# Patient Record
Sex: Female | Born: 1980 | Race: White | Hispanic: Yes | Marital: Single
Health system: Southern US, Community
[De-identification: ages and names within clinical notes are randomized; demographics above are authoritative.]

---

## 2007-02-24 ENCOUNTER — Ambulatory Visit (HOSPITAL_COMMUNITY): Admission: RE | Admit: 2007-02-24 | Discharge: 2007-02-24 | Payer: Self-pay | Admitting: Obstetrics & Gynecology

## 2007-04-19 ENCOUNTER — Inpatient Hospital Stay (HOSPITAL_COMMUNITY): Admission: AD | Admit: 2007-04-19 | Discharge: 2007-04-21 | Payer: Self-pay | Admitting: Gynecology

## 2007-04-19 ENCOUNTER — Ambulatory Visit: Payer: Self-pay | Admitting: Obstetrics & Gynecology

## 2007-04-24 ENCOUNTER — Ambulatory Visit: Payer: Self-pay | Admitting: Obstetrics & Gynecology

## 2007-04-26 ENCOUNTER — Ambulatory Visit: Payer: Self-pay | Admitting: *Deleted

## 2007-04-26 ENCOUNTER — Inpatient Hospital Stay (HOSPITAL_COMMUNITY): Admission: AD | Admit: 2007-04-26 | Discharge: 2007-04-28 | Payer: Self-pay | Admitting: Obstetrics & Gynecology

## 2009-01-09 ENCOUNTER — Ambulatory Visit (HOSPITAL_COMMUNITY): Admission: RE | Admit: 2009-01-09 | Discharge: 2009-01-09 | Payer: Self-pay | Admitting: Obstetrics & Gynecology

## 2009-01-17 ENCOUNTER — Ambulatory Visit (HOSPITAL_COMMUNITY): Admission: RE | Admit: 2009-01-17 | Discharge: 2009-01-17 | Payer: Self-pay | Admitting: Obstetrics & Gynecology

## 2009-01-31 ENCOUNTER — Ambulatory Visit (HOSPITAL_COMMUNITY): Admission: RE | Admit: 2009-01-31 | Discharge: 2009-01-31 | Payer: Self-pay | Admitting: Obstetrics & Gynecology

## 2009-02-24 ENCOUNTER — Encounter: Admission: RE | Admit: 2009-02-24 | Discharge: 2009-03-10 | Payer: Self-pay | Admitting: Obstetrics & Gynecology

## 2009-02-24 ENCOUNTER — Ambulatory Visit: Payer: Self-pay | Admitting: Obstetrics & Gynecology

## 2009-03-03 ENCOUNTER — Ambulatory Visit: Payer: Self-pay | Admitting: Obstetrics & Gynecology

## 2009-03-03 ENCOUNTER — Ambulatory Visit (HOSPITAL_COMMUNITY): Admission: RE | Admit: 2009-03-03 | Discharge: 2009-03-03 | Payer: Self-pay | Admitting: Obstetrics & Gynecology

## 2009-03-03 ENCOUNTER — Encounter: Payer: Self-pay | Admitting: Obstetrics & Gynecology

## 2009-03-03 LAB — CONVERTED CEMR LAB
Chlamydia, DNA Probe: NEGATIVE
GC Probe Amp, Genital: NEGATIVE

## 2009-03-04 ENCOUNTER — Encounter: Payer: Self-pay | Admitting: Obstetrics & Gynecology

## 2009-03-10 ENCOUNTER — Ambulatory Visit: Payer: Self-pay | Admitting: Obstetrics & Gynecology

## 2009-03-14 ENCOUNTER — Ambulatory Visit: Payer: Self-pay | Admitting: Obstetrics & Gynecology

## 2009-03-17 ENCOUNTER — Ambulatory Visit: Payer: Self-pay | Admitting: Obstetrics & Gynecology

## 2009-03-21 ENCOUNTER — Ambulatory Visit: Payer: Self-pay | Admitting: Obstetrics & Gynecology

## 2009-03-23 ENCOUNTER — Inpatient Hospital Stay (HOSPITAL_COMMUNITY): Admission: AD | Admit: 2009-03-23 | Discharge: 2009-03-25 | Payer: Self-pay | Admitting: Obstetrics and Gynecology

## 2009-03-23 ENCOUNTER — Ambulatory Visit: Payer: Self-pay | Admitting: Obstetrics and Gynecology

## 2010-09-17 LAB — POCT URINALYSIS DIP (DEVICE)
Bilirubin Urine: NEGATIVE
Glucose, UA: NEGATIVE mg/dL
Ketones, ur: NEGATIVE mg/dL
Nitrite: NEGATIVE
Protein, ur: NEGATIVE mg/dL
Specific Gravity, Urine: <=1.005 (ref 1.005–1.030)
Urobilinogen, UA: 0.2 mg/dL (ref 0.0–1.0)
pH: 6.5 (ref 5.0–8.0)

## 2010-09-17 LAB — CBC
HCT: 27.4 % — ABNORMAL LOW (ref 36.0–46.0)
HCT: 33.7 % — ABNORMAL LOW (ref 36.0–46.0)
Hemoglobin: 11.5 g/dL — ABNORMAL LOW (ref 12.0–15.0)
Hemoglobin: 9.5 g/dL — ABNORMAL LOW (ref 12.0–15.0)
MCHC: 34.1 g/dL (ref 30.0–36.0)
MCHC: 34.8 g/dL (ref 30.0–36.0)
MCV: 86.4 fL (ref 78.0–100.0)
MCV: 87.6 fL (ref 78.0–100.0)
Platelets: 135 10*3/uL — ABNORMAL LOW (ref 150–400)
Platelets: 164 10*3/uL (ref 150–400)
RBC: 3.13 MIL/uL — ABNORMAL LOW (ref 3.87–5.11)
RBC: 3.89 MIL/uL (ref 3.87–5.11)
RDW: 13.9 % (ref 11.5–15.5)
RDW: 14.2 % (ref 11.5–15.5)
WBC: 6.1 10*3/uL (ref 4.0–10.5)
WBC: 7.4 10*3/uL (ref 4.0–10.5)

## 2010-09-17 LAB — RPR: RPR Ser Ql: NONREACTIVE

## 2010-09-18 LAB — POCT URINALYSIS DIP (DEVICE)
Bilirubin Urine: NEGATIVE
Bilirubin Urine: NEGATIVE
Bilirubin Urine: NEGATIVE
Glucose, UA: NEGATIVE mg/dL
Glucose, UA: NEGATIVE mg/dL
Glucose, UA: NEGATIVE mg/dL
Ketones, ur: NEGATIVE mg/dL
Ketones, ur: NEGATIVE mg/dL
Ketones, ur: NEGATIVE mg/dL
Nitrite: NEGATIVE
Nitrite: NEGATIVE
Nitrite: NEGATIVE
Protein, ur: NEGATIVE mg/dL
Protein, ur: NEGATIVE mg/dL
Protein, ur: NEGATIVE mg/dL
Specific Gravity, Urine: 1.01 (ref 1.005–1.030)
Specific Gravity, Urine: <=1.005 (ref 1.005–1.030)
Specific Gravity, Urine: 1.01 (ref 1.005–1.030)
Urobilinogen, UA: 0.2 mg/dL (ref 0.0–1.0)
Urobilinogen, UA: 0.2 mg/dL (ref 0.0–1.0)
Urobilinogen, UA: 0.2 mg/dL (ref 0.0–1.0)
pH: 5.5 (ref 5.0–8.0)
pH: 6.5 (ref 5.0–8.0)
pH: 7 (ref 5.0–8.0)

## 2010-09-18 LAB — GLUCOSE, CAPILLARY: Glucose-Capillary: 97 mg/dL (ref 70–99)

## 2010-10-27 NOTE — Discharge Summary (Signed)
NAMECAELI, LINEHAN          ACCOUNT NO.:  [id number]   MEDICAL RECORD NO.:  [id number]          PATIENT TYPE:  INP   LOCATION:  9155                          FACILITY:  [facility]   PHYSICIAN:  Phil D. Okey Dupre, M.D.     DATE OF BIRTH:  1981-02-24   DATE OF ADMISSION:  04/19/2007  DATE OF DISCHARGE:  04/21/2007                               DISCHARGE SUMMARY   ADMISSION DIAGNOSES:  1. Intrauterine pregnancy at 35 weeks and 4 days' gestation.  2. Elevated blood pressure.  3. Proteinuria.   DISCHARGE DIAGNOSES:  1. Intrauterine pregnancy at 36 weeks and 0 days' gestation.  2. Mild pre-eclampsia.   PROCEDURES:  1. The patient had a biophysical profile performed on April 20, 2007, showing a score of 8 out of 8, with an AFI of 16.5-cm.  The      infant was cephalic presentation and the placenta was anterior      above the cervical os.  2. The patient had a growth ultrasound performed on April 21, 2007,      showing a 2904 grams fetus in the 74th percentile.  No evidence of      IUGR was noted.  The infant was still in the cephalic presentation.   COMPLICATIONS:  None.   CONSULTATIONS:  None.   BRIEF PERTINENT ADMISSION HISTORY:  This is a 30 year old gravida 2,  para 1-0-0-1, presenting at 35 weeks and 4 days' gestation.  She is a  direct admission from [facility] Department secondary to  elevated blood pressure in the 150/90 range and proteinuria on her urine  protein.  At the time of admission her blood pressure was 156/92.  She  was to be admitted for evaluation for pre-eclampsia.   PERTINENT LABORATORY RESULTS:  The patient had [facility] labs performed on day  of admission with hemoglobin of 12, platelets of 182.  AST of 27, ALT  22, LDH 165, and a uric acid was 5.6.  Routine urinalysis showed greater  than 300 of protein.  She did have gonorrhea and chlamydia probes done  both of which were negative.  Her 24-hour urine for protein and  creatinine showed a urine  creatinine of 1073 with creatinine clearance  of 152.  Her urine volume was 1100.  Her total protein over that 24-hour  period was 4510.  She did have followup [facility] labs on the 7th, showing a  hemoglobin of 12.4, platelets of 169.  AST of 27, ALT of 21, LDH of 148,  and uric acid of 5.9.   HOSPITAL COURSE:  The patient was admitted and followed with routine  blood pressures.  Throughout her stay her blood pressure ranged in the  120 to 140 range.  Her high systolic was 160, high diastolic was 96.  The patient did have a complaint of a mild headache on hospital day #2  that lasted 2 hours, that resolved on its own.  She denied vision  changes, abdominal pain.  The baby was moving well throughout her stay.  She did have NSTs during her stay which were reactive.  Labs  were  followed and were as stated above.  She did have a 24-hour urine with  4500 plus grams of protein.  She was still without symptoms on hospital  day #3.  The patient was ready to be discharged home in stable  condition.   DISCHARGE STATUS:  Stable.   DISCHARGE MEDICATIONS:  The patient is to continue taking prenatal  vitamins one tablet daily.   DISCHARGE INSTRUCTIONS:  1. Discharged to home.  2. Regular diet.  3. The patient is on modified bed rest.  She may ambulate in the house      for meals and to the bathroom but she should abstain from long      periods of standing or walking.  4. The patient is to follow up at the [facility].  An appointment has been made for her for April 24, 2007      at 7:30 in the morning.  She is to have twice weekly NSTs beginning      at that visit.      Karlton Lemon, MD  Electronically Signed     ______________________________  Javier Glazier. Okey Dupre, M.D.    NS/MEDQ  D:  04/21/2007  T:  04/21/2007  Job:  [id number]

## 2011-03-23 LAB — CBC
HCT: 28.3 — ABNORMAL LOW
HCT: 34.3 — ABNORMAL LOW
HCT: 34.9 — ABNORMAL LOW
HCT: 35.6 — ABNORMAL LOW
Hemoglobin: 12
Hemoglobin: 12.2
Hemoglobin: 12.4
Hemoglobin: 9.8 — ABNORMAL LOW
MCHC: 34.7
MCHC: 34.9
MCHC: 35
MCHC: 35.1
MCV: 87.7
MCV: 88
MCV: 88.2
MCV: 88.4
Platelets: 164
Platelets: 169
Platelets: 182
Platelets: 187
RBC: 3.2 — ABNORMAL LOW
RBC: 3.89
RBC: 3.96
RBC: 4.06
RDW: 13
RDW: 13.2
RDW: 13.2
RDW: 13.4
WBC: 10.5
WBC: 7.7
WBC: 8.6
WBC: 8.9

## 2011-03-23 LAB — COMPREHENSIVE METABOLIC PANEL
ALT: 21
ALT: 21
ALT: 22
ALT: 23
AST: 27
AST: 27
AST: 28
AST: 37
Albumin: 1.5 — ABNORMAL LOW
Albumin: 1.6 — ABNORMAL LOW
Albumin: 1.9 — ABNORMAL LOW
Albumin: 2.2 — ABNORMAL LOW
Alkaline Phosphatase: 124 — ABNORMAL HIGH
Alkaline Phosphatase: 147 — ABNORMAL HIGH
Alkaline Phosphatase: 153 — ABNORMAL HIGH
Alkaline Phosphatase: 157 — ABNORMAL HIGH
BUN: 11
BUN: 12
BUN: 9
BUN: 9
CO2: 18 — ABNORMAL LOW
CO2: 19
CO2: 20
CO2: 20
Calcium: 7.3 — ABNORMAL LOW
Calcium: 7.9 — ABNORMAL LOW
Calcium: 8 — ABNORMAL LOW
Calcium: 8.3 — ABNORMAL LOW
Chloride: 106
Chloride: 111
Chloride: 113 — ABNORMAL HIGH
Chloride: 113 — ABNORMAL HIGH
Creatinine, Ser: 0.49
Creatinine, Ser: 0.54
Creatinine, Ser: 0.57
Creatinine, Ser: 0.71
GFR calc Af Amer: >60
GFR calc Af Amer: >60
GFR calc Af Amer: >60
GFR calc Af Amer: >60
GFR calc non Af Amer: >60
GFR calc non Af Amer: >60
GFR calc non Af Amer: >60
GFR calc non Af Amer: >60
Glucose, Bld: 100 — ABNORMAL HIGH
Glucose, Bld: 102 — ABNORMAL HIGH
Glucose, Bld: 169 — ABNORMAL HIGH
Glucose, Bld: 80
Potassium: 3.8
Potassium: 3.8
Potassium: 4
Potassium: 4.6
Sodium: 132 — ABNORMAL LOW
Sodium: 136
Sodium: 137
Sodium: 137
Total Bilirubin: 0.5
Total Bilirubin: 0.5
Total Bilirubin: 0.5
Total Bilirubin: 0.6
Total Protein: 3.9 — ABNORMAL LOW
Total Protein: 4.5 — ABNORMAL LOW
Total Protein: 4.8 — ABNORMAL LOW
Total Protein: 5.1 — ABNORMAL LOW

## 2011-03-23 LAB — LACTATE DEHYDROGENASE
LDH: 148
LDH: 165
LDH: 166
LDH: 217

## 2011-03-23 LAB — URIC ACID
Uric Acid, Serum: 5.6
Uric Acid, Serum: 5.9
Uric Acid, Serum: 6.2
Uric Acid, Serum: 6.2

## 2011-03-23 LAB — URINALYSIS, ROUTINE W REFLEX MICROSCOPIC
Bilirubin Urine: NEGATIVE
Glucose, UA: NEGATIVE
Ketones, ur: NEGATIVE
Leukocytes, UA: NEGATIVE
Nitrite: NEGATIVE
Protein, ur: >300 — AB
Specific Gravity, Urine: 1.025
Urobilinogen, UA: 0.2
pH: 6

## 2011-03-23 LAB — POCT URINALYSIS DIP (DEVICE)
Glucose, UA: NEGATIVE
Nitrite: NEGATIVE
Operator id: 120861
Protein, ur: >=300 — AB
Specific Gravity, Urine: 1.02
Urobilinogen, UA: 0.2
pH: 7

## 2011-03-23 LAB — GC/CHLAMYDIA PROBE AMP, GENITAL
Chlamydia, DNA Probe: NEGATIVE
GC Probe Amp, Genital: NEGATIVE

## 2011-03-23 LAB — DIFFERENTIAL
Basophils Absolute: 0
Basophils Relative: 0
Eosinophils Absolute: 0
Eosinophils Relative: 0
Lymphocytes Relative: 19
Lymphs Abs: 1.6
Monocytes Absolute: 0.6
Monocytes Relative: 7
Neutro Abs: 6.3
Neutrophils Relative %: 73

## 2011-03-23 LAB — STREP B DNA PROBE: Strep Group B Ag: NEGATIVE

## 2011-03-23 LAB — CREATININE CLEARANCE, URINE, 24 HOUR
Collection Interval-CRCL: 24
Creatinine Clearance: 152 — ABNORMAL HIGH
Creatinine, 24H Ur: 1073
Creatinine, Urine: 97.5
Creatinine: 0.49
Urine Total Volume-CRCL: 1100

## 2011-03-23 LAB — MAGNESIUM
Magnesium: 4.5 — ABNORMAL HIGH
Magnesium: 5.2 — ABNORMAL HIGH

## 2011-03-23 LAB — TYPE AND SCREEN
ABO/RH(D): O POS
Antibody Screen: NEGATIVE

## 2011-03-23 LAB — PROTEIN, URINE, 24 HOUR
Collection Interval-UPROT: 24
Protein, 24H Urine: 4510 — ABNORMAL HIGH
Protein, Urine: 410
Urine Total Volume-UPROT: 1100

## 2011-03-23 LAB — URINE MICROSCOPIC-ADD ON

## 2011-03-23 LAB — RUBELLA SCREEN: Rubella: 246 — ABNORMAL HIGH

## 2011-03-23 LAB — HEPATITIS B SURFACE ANTIGEN: Hepatitis B Surface Ag: NEGATIVE

## 2011-03-23 LAB — RPR: RPR Ser Ql: NONREACTIVE

## 2011-03-23 LAB — RAPID HIV SCREEN (WH-MAU): Rapid HIV Screen: NONREACTIVE

## 2011-03-23 LAB — ABO/RH: ABO/RH(D): O POS

## 2011-04-04 IMAGING — US US OB TRANSVAGINAL
1 series · 14 of 17 positions shown · non-contrast
Comparison: none

OBSTETRICAL ULTRASOUND:
 This ultrasound exam was performed in the [HOSPITAL] Ultrasound Department.  The OB US report was generated in the AS system, and faxed to the ordering physician.  This report is also available in [REDACTED] PACS.

[Series 1: us ob transvaginal · 0.25mm/px · 17 acquisitions, 14 frames shown]
[im 1/17]
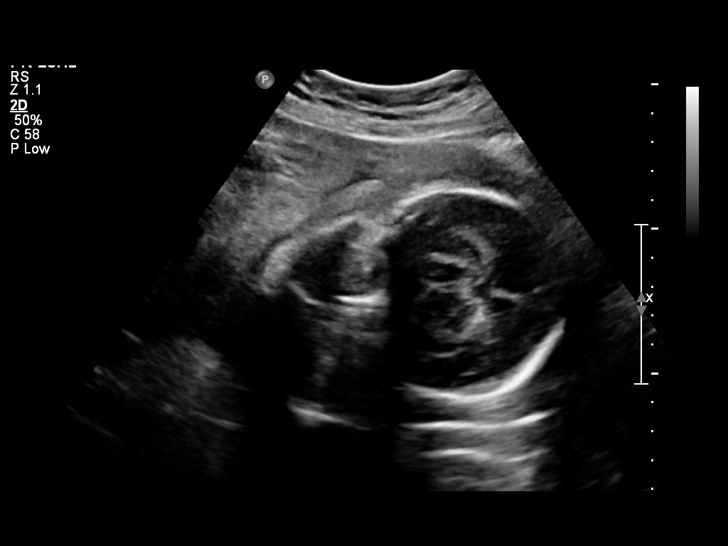
[im 2/17]
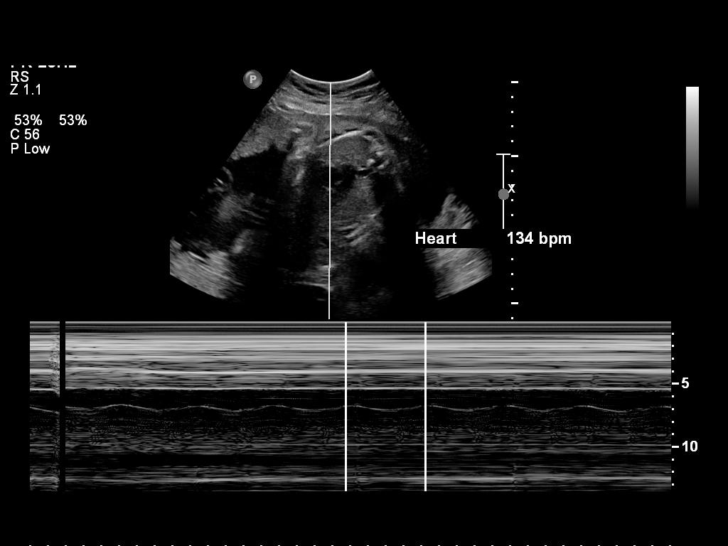
[im 4/17]
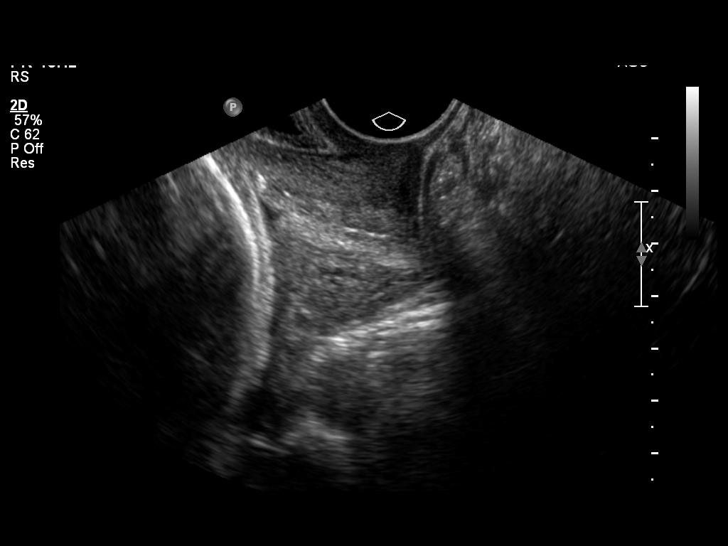
[im 5/17]
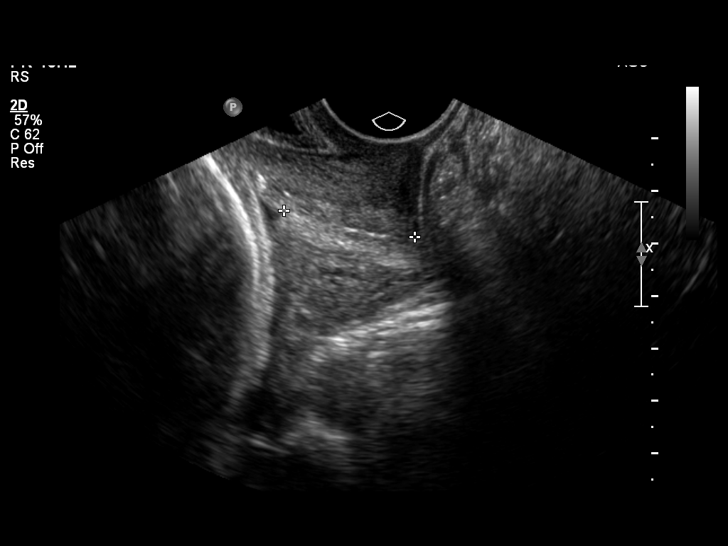
[im 6/17]
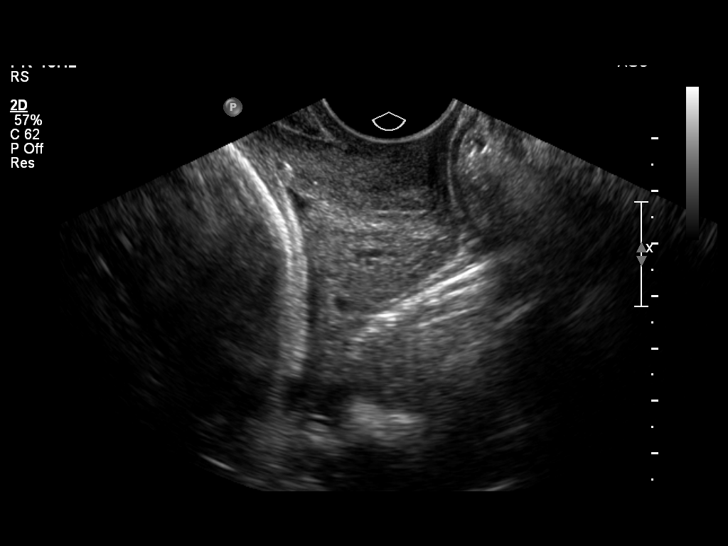
[im 7/17]
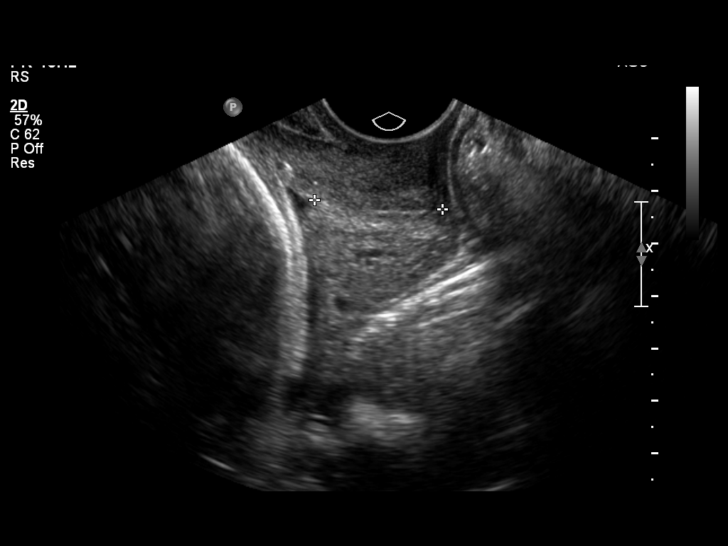
[im 8/17]
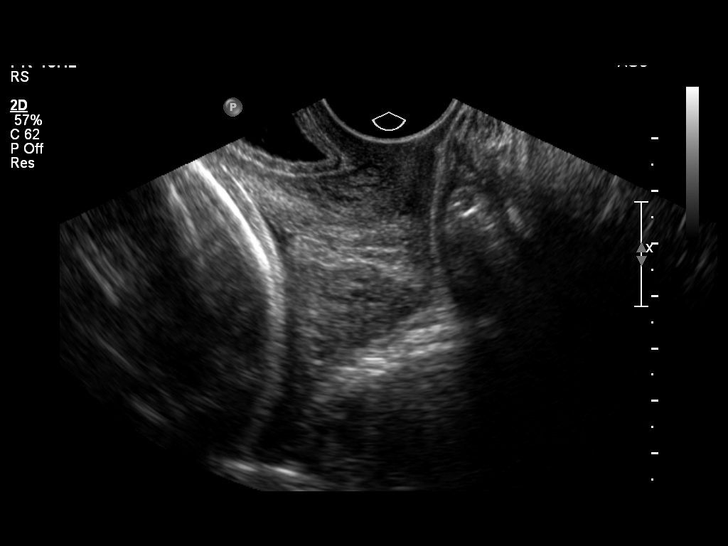
[im 10/17]
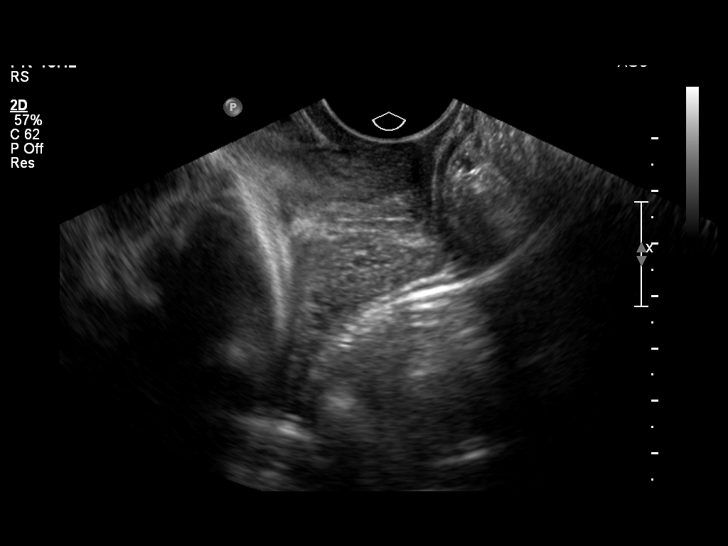
[im 11/17]
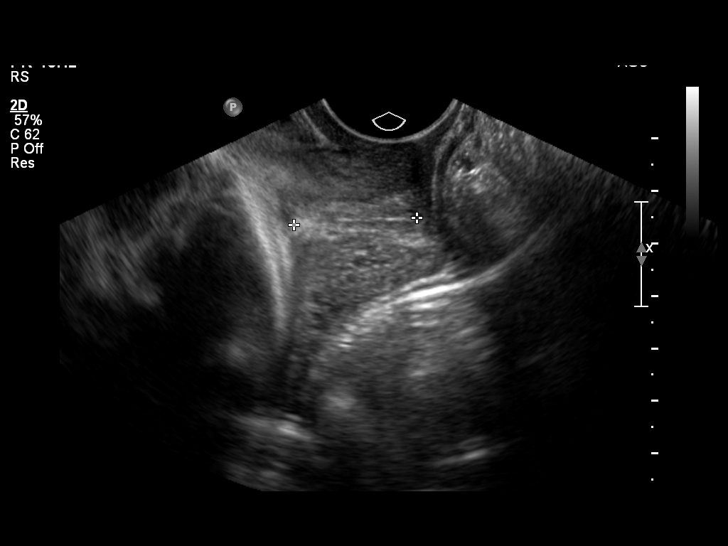
[im 12/17]
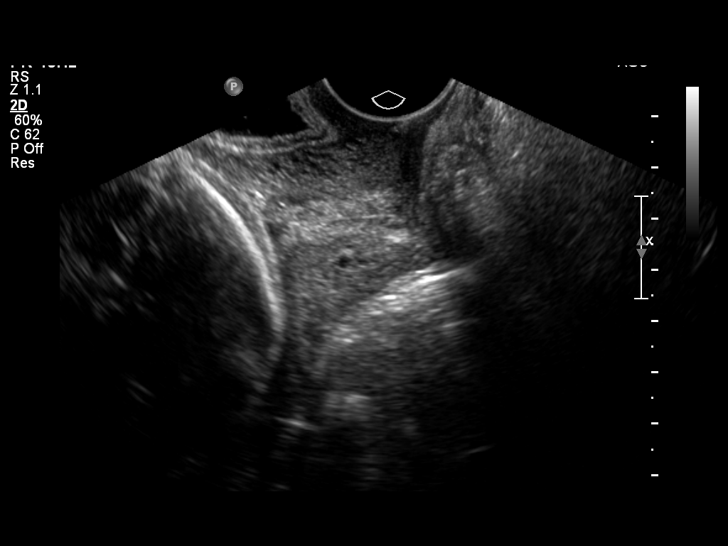
[im 13/17]
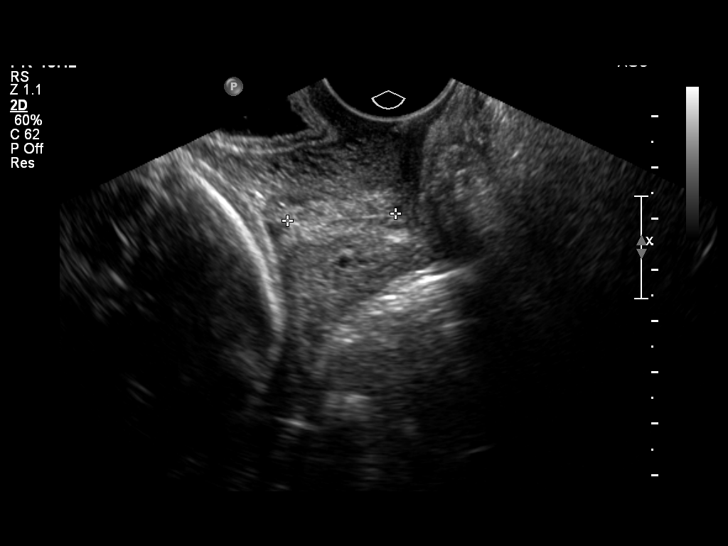
[im 14/17]
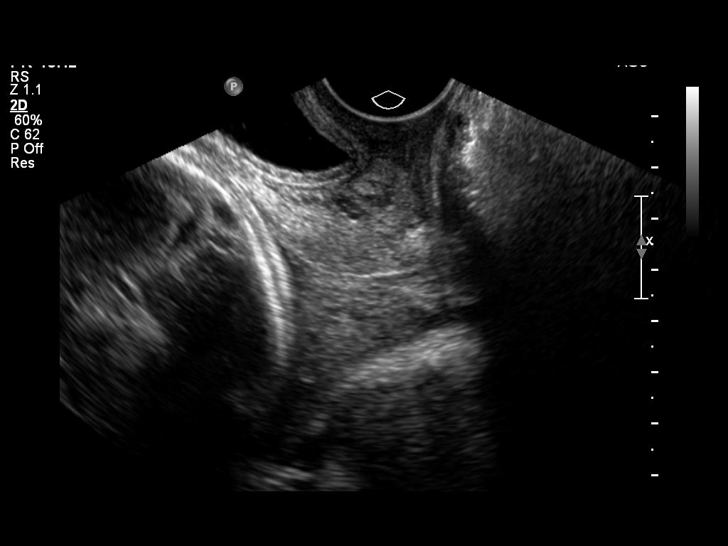
[im 16/17]
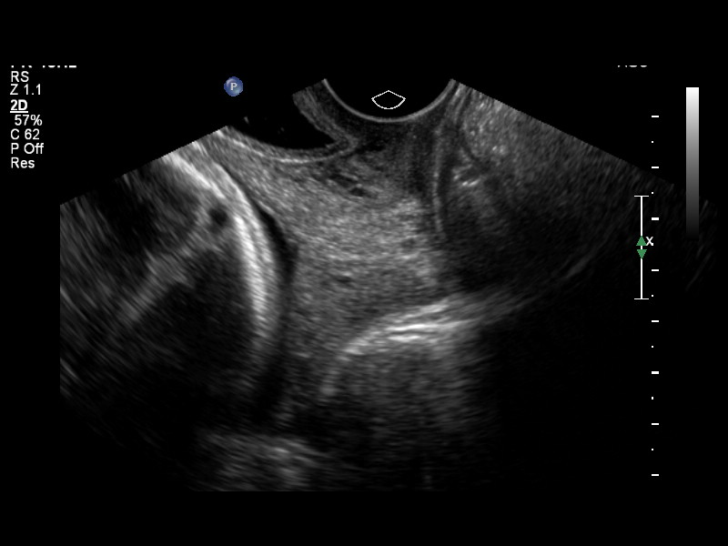
[im 17/17]
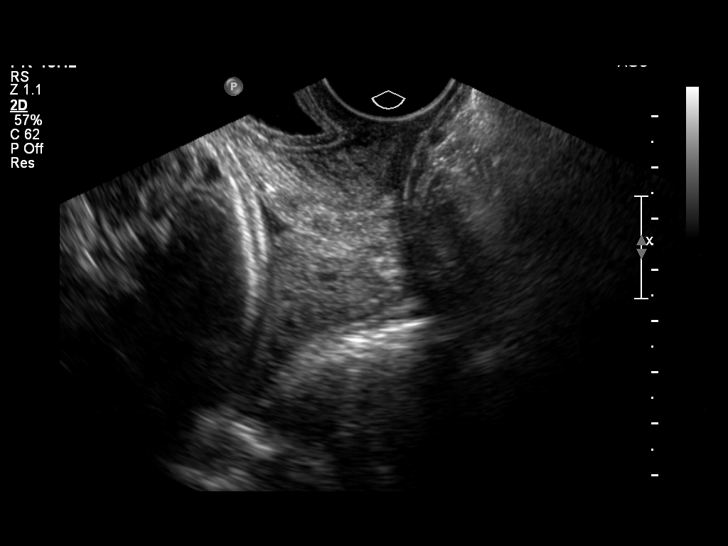

[14 of 17 positions shown; findings below may reference images not displayed]

IMPRESSION: See AS Obstetric US report.

## 2011-05-19 IMAGING — US US OB FOLLOW-UP
1 series · 14 of 28 positions shown · non-contrast
Comparison: none

OBSTETRICAL ULTRASOUND:
 This ultrasound exam was performed in the [HOSPITAL] Ultrasound Department.  The OB US report was generated in the AS system, and faxed to the ordering physician.  This report is also available in [REDACTED] PACS.

[Series 1: us ob follow up · 37 acquisitions, 14 frames shown]
[im 2/37]
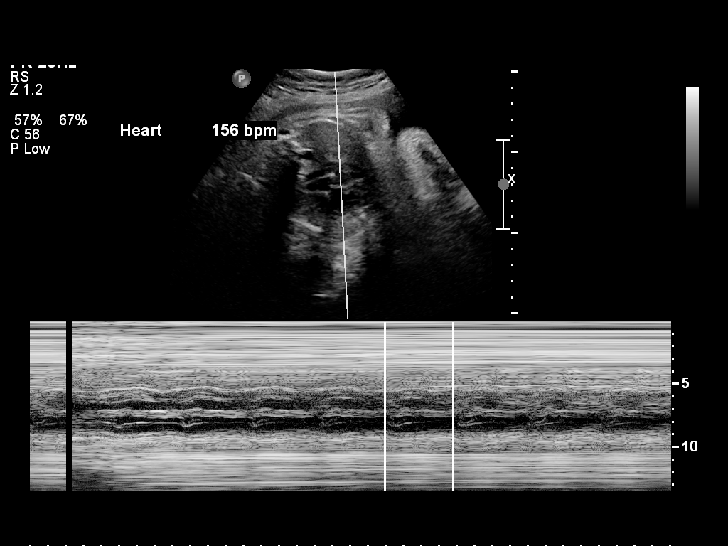
[im 5/37]
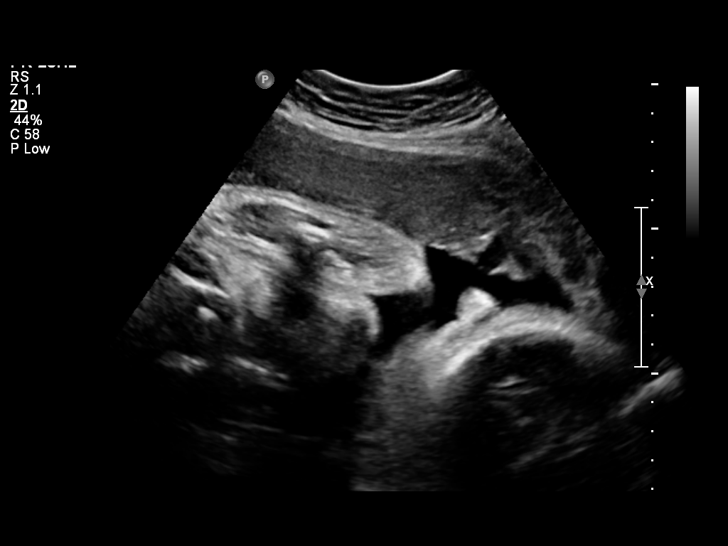
[im 7/37]
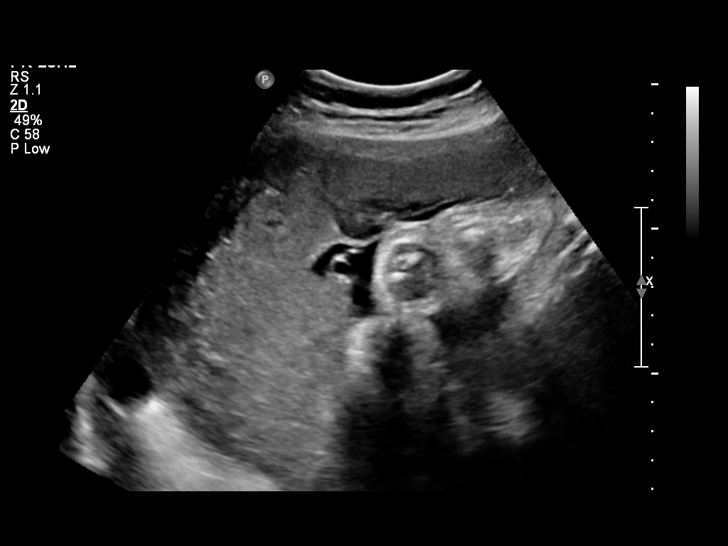
[im 10/37]
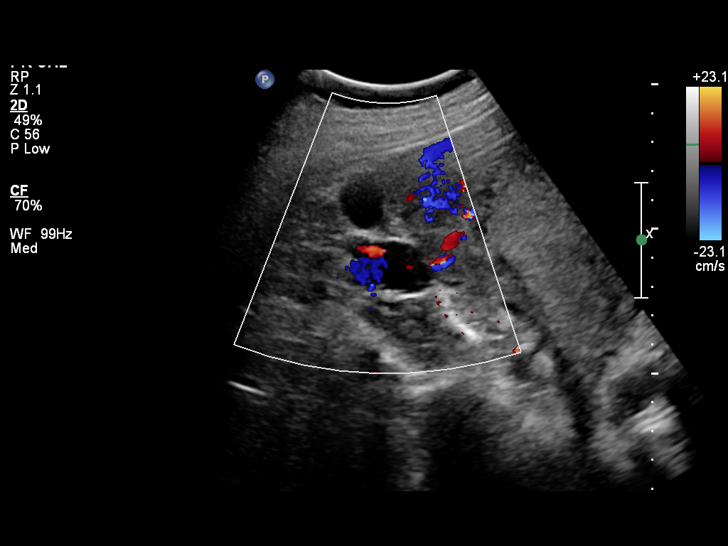
[im 13/37]
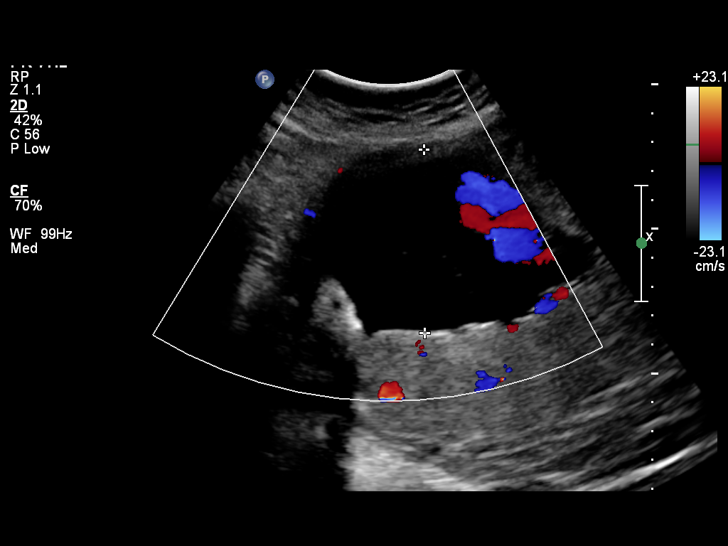
[im 15/37]
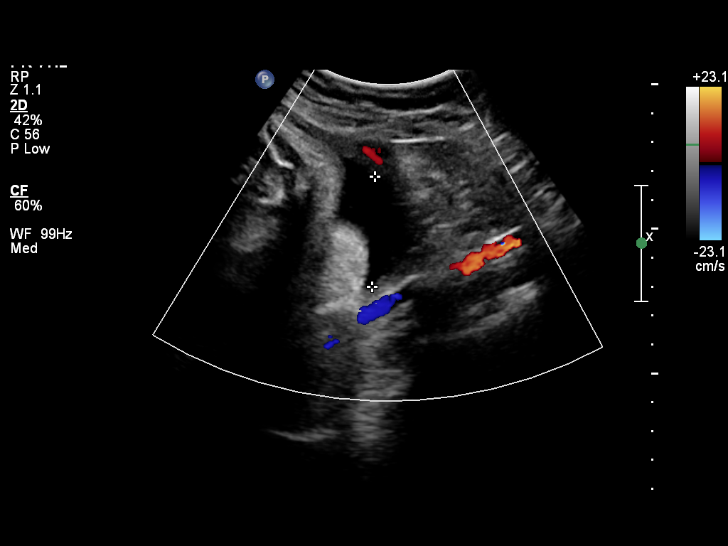
[im 18/37]
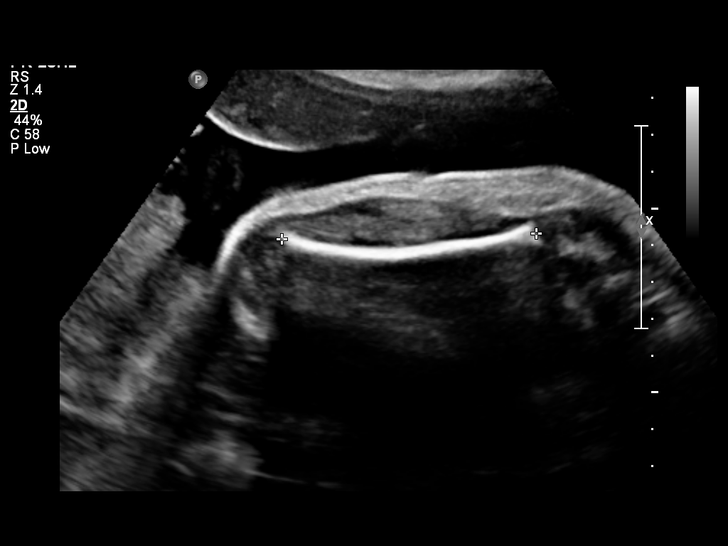
[im 21/37]
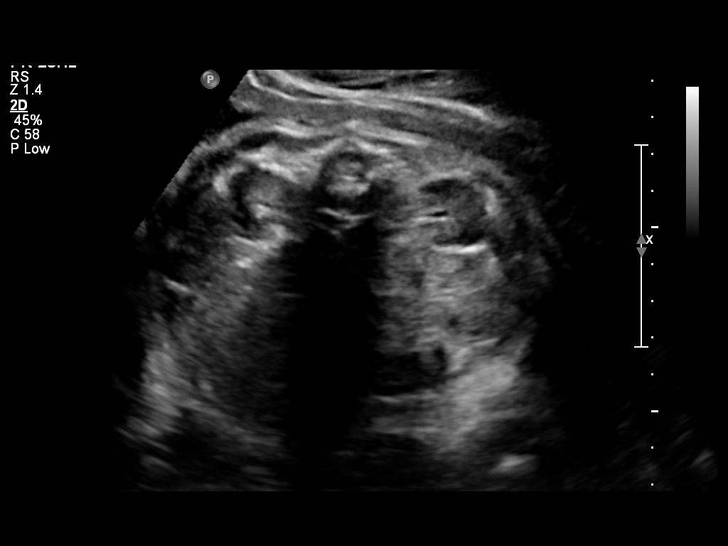
[im 23/37]
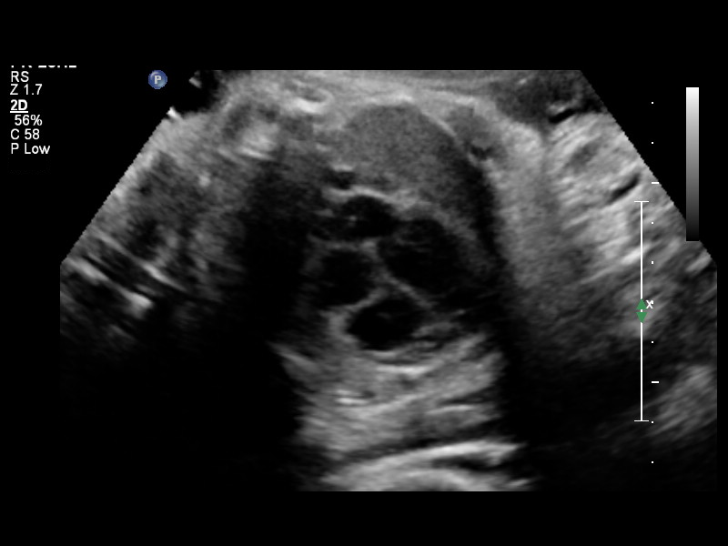
[im 26/37]
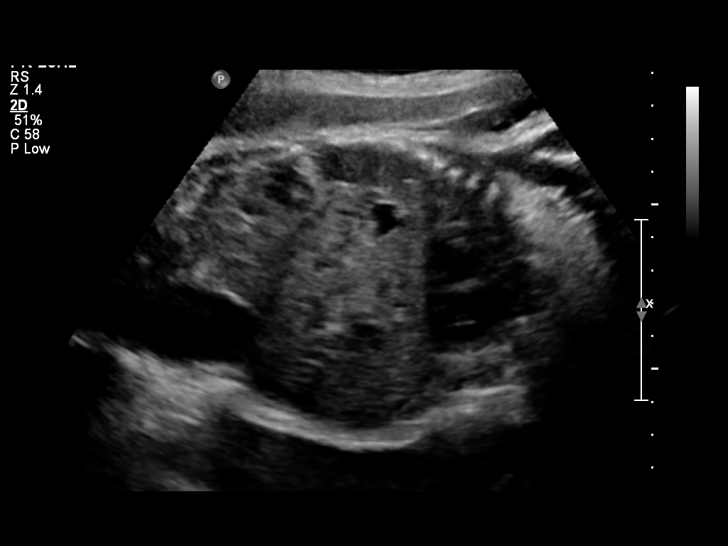
[im 29/37]
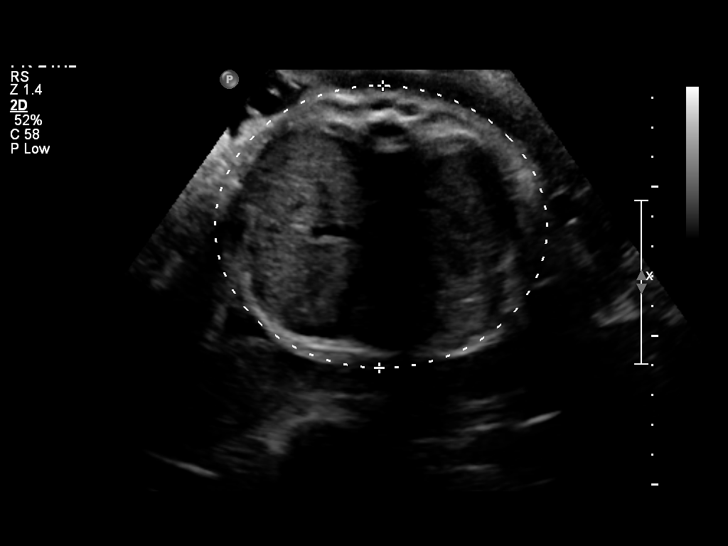
[im 31/37]
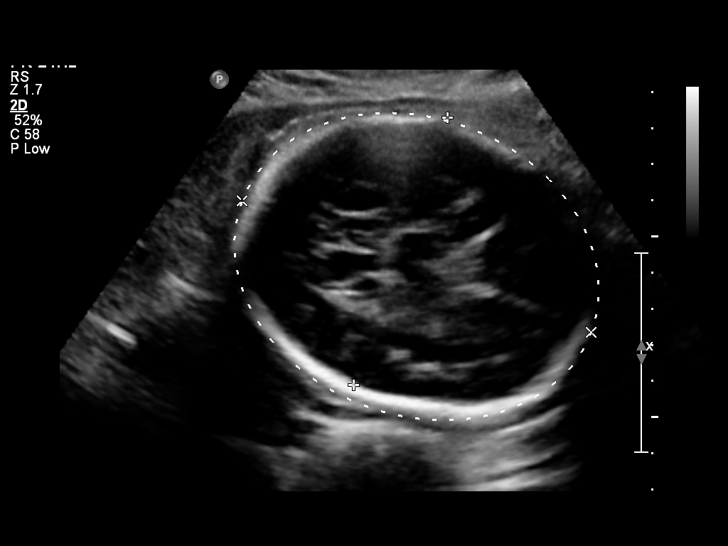
[im 34/37]
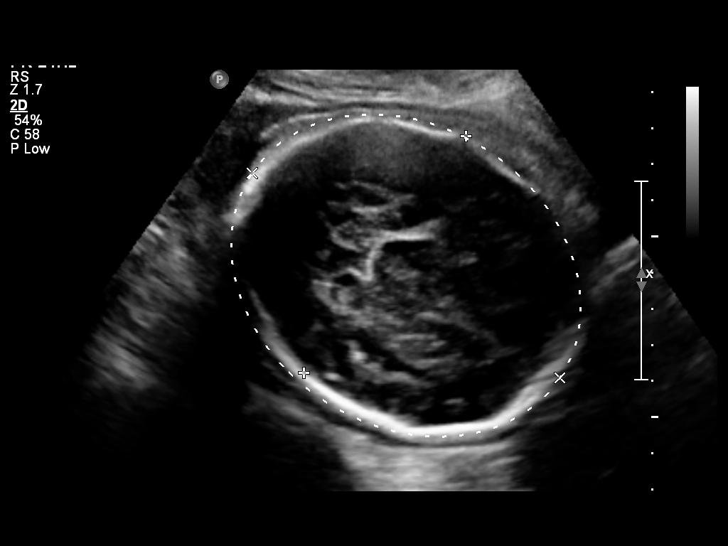
[im 37/37]
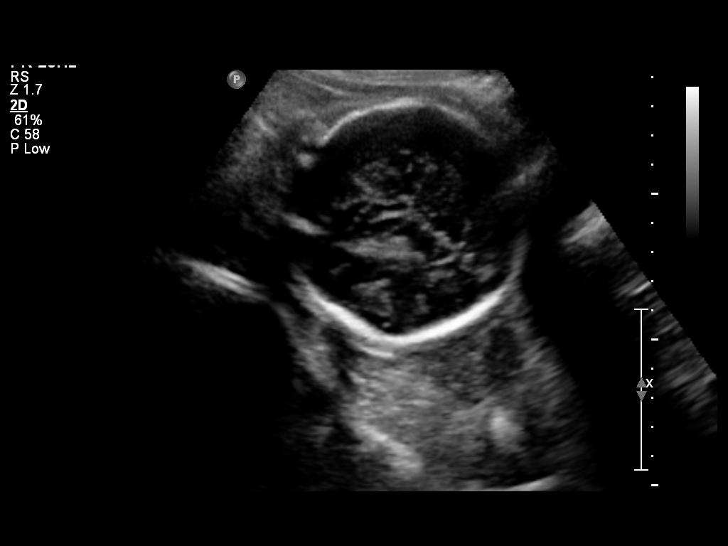

[14 of 28 positions shown; findings below may reference images not displayed]

IMPRESSION: See AS Obstetric US report.

## 2013-10-16 ENCOUNTER — Emergency Department (INDEPENDENT_AMBULATORY_CARE_PROVIDER_SITE_OTHER)
Admission: EM | Admit: 2013-10-16 | Discharge: 2013-10-16 | Disposition: A | Discharge status: Home / Self Care | Payer: Self-pay | Source: Home / Self Care | Attending: Family Medicine | Admitting: Family Medicine

## 2013-10-16 ENCOUNTER — Encounter (HOSPITAL_COMMUNITY): Payer: Self-pay | Admitting: Emergency Medicine

## 2013-10-16 DIAGNOSIS — J302 Other seasonal allergic rhinitis: Secondary | ICD-10-CM

## 2013-10-16 DIAGNOSIS — H1010 Acute atopic conjunctivitis, unspecified eye: Secondary | ICD-10-CM

## 2013-10-16 DIAGNOSIS — H1045 Other chronic allergic conjunctivitis: Secondary | ICD-10-CM

## 2013-10-16 DIAGNOSIS — J309 Allergic rhinitis, unspecified: Secondary | ICD-10-CM

## 2013-10-16 MED ORDER — CETIRIZINE HCL 10 MG PO TABS: 10.0000 mg | ORAL_TABLET | Freq: Every day | ORAL | Status: AC

## 2013-10-16 MED ORDER — TRIAMCINOLONE ACETONIDE 40 MG/ML IJ SUSP: 40.0000 mg | Freq: Once | INTRAMUSCULAR | Status: DC

## 2013-10-16 MED ORDER — METHYLPREDNISOLONE ACETATE 40 MG/ML IJ SUSP: 80.0000 mg | Freq: Once | INTRAMUSCULAR | Status: DC

## 2013-10-16 MED ORDER — KETOTIFEN FUMARATE 0.025 % OP SOLN: 2.0000 [drp] | Freq: Two times a day (BID) | OPHTHALMIC | Status: AC

## 2013-10-16 MED ORDER — FLUTICASONE PROPIONATE 50 MCG/ACT NA SUSP: 1.0000 | Freq: Two times a day (BID) | NASAL | Status: AC

## 2013-10-16 NOTE — ED Notes (Signed)
Sore throat, ears hurt and both eyes are red.  Reports drainage from eyes in the am

## 2013-10-16 NOTE — ED Provider Notes (Signed)
CSN: [id number]     Arrival date & time 10/16/13  1249 History   First MD Initiated Contact with Patient 10/16/13 1520     Chief Complaint  Patient presents with  . URI   (Consider location/radiation/quality/duration/timing/severity/associated sxs/prior Treatment) Patient is a 33 y.o. female presenting with URI. The history is provided by the patient.  URI Presenting symptoms: congestion and rhinorrhea   Presenting symptoms: no cough and no fever   Severity:  Moderate Onset quality:  Sudden Duration:  2 days Progression:  Worsening Chronicity:  Recurrent Relieved by:  None tried Worsened by:  Nothing tried Ineffective treatments:  OTC medications Associated symptoms: sneezing   Associated symptoms: no wheezing     History reviewed. No pertinent past medical history. History reviewed. No pertinent past surgical history. No family history on file. History  Substance Use Topics  . Smoking status: Not on file  . Smokeless tobacco: Not on file  . Alcohol Use: Not on file   OB History   Grav Para Term Preterm Abortions TAB SAB Ect Mult Living                 Review of Systems  Constitutional: Negative.  Negative for fever.  HENT: Positive for congestion, postnasal drip, rhinorrhea and sneezing.   Respiratory: Negative for cough and wheezing.   Cardiovascular: Negative.   Gastrointestinal: Negative.     Allergies  Review of patient's allergies indicates no known allergies.  Home Medications   Prior to Admission medications   Medication Sig Start Date End Date Taking? Authorizing Provider  ibuprofen (ADVIL,MOTRIN) 200 MG tablet Take 200 mg by mouth every 6 (six) hours as needed.   Yes Historical Provider, MD  cetirizine (ZYRTEC) 10 MG tablet Take 1 tablet (10 mg total) by mouth daily. One tab daily for allergies 10/16/13   Linna HoffJames D Sufyan Meidinger, MD  fluticasone [facility](FLONASE) 50 MCG/ACT nasal spray Place 1 spray into both nostrils 2 (two) times daily. 10/16/13   Linna HoffJames D Marypat Kimmet, MD   ketotifen (ZADITOR) 0.025 % ophthalmic solution Place 2 drops into both eyes 2 (two) times daily. 10/16/13   Linna HoffJames D Tomie Elko, MD   BP 120/76  Pulse 74  Temp(Src) 98.3 F (36.8 C) (Oral)  Resp 14  SpO2 98% Physical Exam  Nursing note and vitals reviewed. Constitutional: She is oriented to person, place, and time. She appears well-developed and well-nourished.  HENT:  Head: Normocephalic.  Right Ear: External ear normal.  Left Ear: External ear normal.  Nose: Mucosal edema and rhinorrhea present.  Mouth/Throat: Oropharynx is clear and moist.  Eyes: EOM are normal. Pupils are equal, round, and reactive to light. Right conjunctiva is injected. Left conjunctiva is injected.  Neck: Normal range of motion. Neck supple.  Cardiovascular: Regular rhythm and normal heart sounds.   Pulmonary/Chest: Effort normal and breath sounds normal.  Abdominal: Bowel sounds are normal. There is no tenderness.  Lymphadenopathy:    She has no cervical adenopathy.  Neurological: She is alert and oriented to person, place, and time.  Skin: Skin is warm and dry.    ED Course  Procedures (including critical care time) Labs Review Labs Reviewed - No data to display  Imaging Review No results found.   MDM   1. Seasonal allergic conjunctivitis   2. Seasonal allergic rhinitis        Linna HoffJames D Himani Corona, MD 10/16/13 [phone]

## 2020-06-08 ENCOUNTER — Other Ambulatory Visit: Payer: Self-pay

## 2020-06-08 ENCOUNTER — Emergency Department (HOSPITAL_COMMUNITY)
Admission: EM | Admit: 2020-06-08 | Discharge: 2020-06-09 | Disposition: A | Discharge status: Home / Self Care | Payer: HRSA Program | Attending: Emergency Medicine | Admitting: Emergency Medicine

## 2020-06-08 DIAGNOSIS — U071 COVID-19: Secondary | ICD-10-CM | POA: Diagnosis not present

## 2020-06-08 DIAGNOSIS — R509 Fever, unspecified: Secondary | ICD-10-CM | POA: Diagnosis present

## 2020-06-08 LAB — RESP PANEL BY RT-PCR (FLU A&B, COVID) ARPGX2
Influenza A by PCR: NEGATIVE
Influenza B by PCR: NEGATIVE
SARS Coronavirus 2 by RT PCR: POSITIVE — AB

## 2020-06-08 LAB — GROUP A STREP BY PCR: Group A Strep by PCR: NOT DETECTED

## 2020-06-08 NOTE — ED Triage Notes (Signed)
Pt c/o sore throat that began on Wednesday, then a fever and now muscle aches.

## 2020-06-09 ENCOUNTER — Telehealth (HOSPITAL_COMMUNITY): Payer: Self-pay | Admitting: Nurse Practitioner

## 2020-06-09 ENCOUNTER — Encounter (HOSPITAL_COMMUNITY): Payer: Self-pay | Admitting: Student

## 2020-06-09 DIAGNOSIS — U071 COVID-19: Secondary | ICD-10-CM

## 2020-06-09 MED ORDER — ACETAMINOPHEN ER 650 MG PO TBCR: 650.0000 mg | EXTENDED_RELEASE_TABLET | Freq: Three times a day (TID) | ORAL | 0 refills | Status: AC | PRN

## 2020-06-09 MED ORDER — ACETAMINOPHEN 500 MG PO TABS
1000.0000 mg | ORAL_TABLET | Freq: Once | ORAL | Status: AC
  Administered 2020-06-09: 1000 mg via ORAL
  Filled 2020-06-09: qty 2

## 2020-06-09 MED ORDER — NAPROXEN 500 MG PO TABS: 500.0000 mg | ORAL_TABLET | Freq: Two times a day (BID) | ORAL | 0 refills | Status: AC | PRN

## 2020-06-09 NOTE — ED Notes (Signed)
s1 s2 good, lung sound good. Pt reports NO SOB with exertion

## 2020-06-09 NOTE — Telephone Encounter (Signed)
Called to discuss with Katrine Coho about Covid symptoms and the use of a monoclonal antibody infusion for those with mild to moderate Covid symptoms and at a high risk of hospitalization.     Pt is qualified for this infusion at the [facility] infusion center due to co-morbid conditions and/or a member of an at-risk group, however declines infusion at this time. Symptoms tier reviewed as well as criteria for ending isolation.  Symptoms reviewed that would warrant ED/Hospital evaluation. Preventative practices reviewed. Patient verbalized understanding. Patient advised to call back if he/she opts to proceed with infusion. Callback number provided. Urgent care and/or ER precautions given for severe symptoms. Last date eligible for infusion: 06/09/20   SVI-4 There are no problems to display for this patient.    Consuello Masse, NP [phone] [email or web address]

## 2020-06-09 NOTE — ED Provider Notes (Signed)
[facility] EMERGENCY DEPARTMENT Provider Note   CSN: [id number] Arrival date & time: 06/08/20  1938     History Chief Complaint  Patient presents with  . Generalized Body Aches    Jackie Green is a 39 y.o. female without significant past medical history who presents to the emergency department with complaints of fever over the past 6 days.  Patient states she is had subjective fever, chills, sore throat, minimal cough, as well as generalized body aches.  No alleviating or aggravating factors to her symptoms.  She has not had a Covid vaccine.  She denies chest pain, dyspnea, vomiting, diarrhea, abdominal pain, hemoptysis, or leg pain/swelling.  Patient speaks english/spanish. 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   HPI     History reviewed. No pertinent past medical history.  There are no problems to display for this patient.   History reviewed. No pertinent surgical history.   OB History   No obstetric history on file.     History reviewed. No pertinent family history.     Home Medications Prior to Admission medications   Medication Sig Start Date End Date Taking? Authorizing Provider  cetirizine (ZYRTEC) 10 MG tablet Take 1 tablet (10 mg total) by mouth daily. One tab daily for allergies 10/16/13   Linna Hoff, MD  fluticasone [facility]) 50 MCG/ACT nasal spray Place 1 spray into both nostrils 2 (two) times daily. 10/16/13   Linna Hoff, MD  ibuprofen (ADVIL,MOTRIN) 200 MG tablet Take 200 mg by mouth every 6 (six) hours as needed.    [provider]  ketotifen (ZADITOR) 0.025 % ophthalmic solution Place 2 drops into both eyes 2 (two) times daily. 10/16/13   Linna Hoff, MD    Allergies    Patient has no known allergies.  Review of Systems   Review of Systems  Constitutional: Positive for chills and fever.  HENT: Positive for sore throat. Negative for congestion, ear pain and voice change.   Respiratory:  Positive for cough. Negative for shortness of breath.   Cardiovascular: Negative for chest pain and leg swelling.  Gastrointestinal: Negative for abdominal pain, diarrhea and vomiting.  Genitourinary: Negative for dysuria.  Neurological: Negative for syncope.  All other systems reviewed and are negative.   Physical Exam Updated Vital Signs BP 125/65   Pulse 91   Temp (!) 100.9 F (38.3 C) (Oral)   Resp 17   Wt 59 kg   SpO2 98%   Physical Exam Vitals and nursing note reviewed.  Constitutional:      General: She is not in acute distress.    Appearance: She is well-developed.  HENT:     Head: Normocephalic and atraumatic.     Right Ear: Ear canal normal. Tympanic membrane is not perforated, erythematous, retracted or bulging.     Left Ear: Ear canal normal. Tympanic membrane is not perforated, erythematous, retracted or bulging.     Ears:     Comments: No mastoid erythema/swelling/tenderness.     Nose:     Right Sinus: No maxillary sinus tenderness or frontal sinus tenderness.     Left Sinus: No maxillary sinus tenderness or frontal sinus tenderness.     Mouth/Throat:     Pharynx: Uvula midline. No oropharyngeal exudate or posterior oropharyngeal erythema.     Comments: Posterior oropharynx is symmetric appearing. Patient tolerating own secretions without difficulty. No trismus. No drooling. No hot potato voice. No swelling beneath the tongue, submandibular compartment is soft.  Eyes:     General:        Right eye: No discharge.        Left eye: No discharge.     Conjunctiva/sclera: Conjunctivae normal.     Pupils: Pupils are equal, round, and reactive to light.  Cardiovascular:     Rate and Rhythm: Normal rate and regular rhythm.     Heart sounds: No murmur heard.   Pulmonary:     Effort: Pulmonary effort is normal. No respiratory distress.     Breath sounds: Normal breath sounds. No wheezing, rhonchi or rales.  Abdominal:     General: There is no distension.      Palpations: Abdomen is soft.     Tenderness: There is no abdominal tenderness.  Musculoskeletal:     Cervical back: Normal range of motion and neck supple. No edema or rigidity.  Lymphadenopathy:     Cervical: No cervical adenopathy.  Skin:    General: Skin is warm and dry.     Findings: No rash.  Neurological:     Mental Status: She is alert.  Psychiatric:        Behavior: Behavior normal.     ED Results / Procedures / Treatments   Labs (all labs ordered are listed, but only abnormal results are displayed) Labs Reviewed  RESP PANEL BY RT-PCR (FLU A&B, COVID) ARPGX2 - Abnormal; Notable for the following components:      Result Value   SARS Coronavirus 2 by RT PCR POSITIVE (*)    All other components within normal limits  GROUP A STREP BY PCR    EKG None  Radiology No results found.  Procedures Procedures (including critical care time)  Medications Ordered in ED Medications  acetaminophen (TYLENOL) tablet 1,000 mg (1,000 mg Oral Given 06/09/20 0932)    ED Course  I have reviewed the triage vital signs and the nursing notes.  Pertinent labs & imaging results that were available during my care of the patient were reviewed by me and considered in my medical decision making (see chart for details).    Jackie Green was evaluated in Emergency Department on 06/09/2020 for the symptoms described in the history of present illness. He/she was evaluated in the context of the global COVID-19 pandemic, which necessitated consideration that the patient might be at risk for infection with the SARS-CoV-2 virus that causes COVID-19. Institutional protocols and algorithms that pertain to the evaluation of patients at risk for COVID-19 are in a state of rapid change based on information released by regulatory bodies including the CDC and federal and state organizations. These policies and algorithms were followed during the patient's care in the ED.  MDM Rules/Calculators/A&P                           Patient presents to the ED with complaints of fever.  She is nontoxic, vitals with mild fever of 100.9, otherwise unremarkable.  She is a fairly benign physical exam.  Additional history obtained:  Additional history obtained from chart review & nursing note review.   Lab Tests:  I reviewed and interpreted labs, which included:  Strep test: Negative Influenza test: Negative COVID-19 test: Positive ED Course:  Exam is without signs of AOM, AOE, or mastoiditis. Oropharyngeal exam is benign, centor score 0, doubt strep, not consistent w/ RPA/PTA. No sinus tenderness. No meningeal signs. Lungs are CTA without focal adventitious sounds, no signs of increased work of breathing, doubt CAP.  No signs of respiratory distress.  Abdomen nontender w/o peritoneal signs. Suspect sxs secondary to COVID 19. Discussed isolation. Will tx supportively. I discussed results, treatment plan, need for follow-up, and return precautions with the patient. Provided opportunity for questions, patient confirmed understanding and is in agreement with plan.    Portions of this note were generated with Scientist, clinical (histocompatibility and immunogenetics). Dictation errors may occur despite best attempts at proofreading.   Final Clinical Impression(s) / ED Diagnoses Final diagnoses:  COVID-19    Rx / DC Orders ED Discharge Orders         Ordered    acetaminophen (TYLENOL 8 HOUR) 650 MG CR tablet  Every 8 hours PRN        06/09/20 0811    naproxen (NAPROSYN) 500 MG tablet  2 times daily PRN        06/09/20 0811           Cherly Anderson, PA-C 06/09/20 0826    Sabino Donovan, MD 06/09/20 [phone]

## 2020-06-09 NOTE — Discharge Instructions (Addendum)
You were seen in the emergency department today for a sore throat.  Your strep test was negative.  Your COVID-19 test was positive which we suspect is causing your symptoms. We are instructing patient's with COVID 19 or symptoms of COVID 19 to isolate themselves for 14 days. You may be able to discontinue self isolation if the following conditions are met:   Persons with COVID-19 who have symptoms and were directed to care for themselves at home may discontinue home isolation under the  following conditions: - It has been at least 10 days have passed since symptoms first appeared. - AND at least 3 days (72 hours) have passed since recovery defined as resolution of fever without the use of fever-reducing medications and improvement in respiratory symptoms (e.g., cough, shortness of breath)  Please follow the below instructions.   We are sending you home with the following medicines: - Naproxen is a nonsteroidal anti-inflammatory medication that will help with pain and swelling. Be sure to take this medication as prescribed with food, 1 pill every 12 hours,  It should be taken with food, as it can cause stomach upset, and more seriously, stomach bleeding. Do not take other nonsteroidal anti-inflammatory medications with this such as Advil, Motrin, Aleve, Mobic, Goodie Powder, or Motrin.    - Tylenol-take every 8 hours as needed for fever or pain. We have prescribed you new medication(s) today. Discuss the medications prescribed today with your pharmacist as they can have adverse effects and interactions with your other medicines including over the counter and prescribed medications. Seek medical evaluation if you start to experience new or abnormal symptoms after taking one of these medicines, seek care immediately if you start to experience difficulty breathing, feeling of your throat closing, facial swelling, or rash as these could be indications of a more serious allergic reaction  Please follow up  with primary care within 3-5 days for re-evaluation- call prior to going to the office to make them aware of your symptoms as some offices are altering their method of seeing patients with COVID 19 symptoms, we have also provided our [place] covid clinic for follow up as well.  Return to the ER for new or worsening symptoms including but not limited to increased work of breathing, chest pain, passing out, or any other concerns.   English to [place] Google translate which may lead to change in interpretation.  Hoy lo vieron en el departamento de emergencias por dolor de garganta. Su prueba de estreptococos fue negativa. Su prueba de COVID-19 fue positiva y sospechamos que est causando sus sntomas. Estamos instruyendo a los pacientes con COVID 19 o sntomas de COVID 19 que se aslen durante 14 das. Es posible que pueda interrumpir el autoaislamiento si se cumplen las siguientes condiciones:  Las personas con COVID-19 que tienen sntomas y se les indic que se cuidaran a s Tax inspector pueden interrumpir el aislamiento domiciliario en las siguientes condiciones: - Han pasado al menos 10 das desde que aparecieron los primeros sntomas. - Y han pasado al menos 3 das (72 horas) desde la recuperacin definida como la resolucin de la fiebre sin el uso de medicamentos para reducir la fiebre y la mejora de los sntomas respiratorios (por ejemplo, tos, dificultad para respirar)  Siga las instrucciones a continuacin.  Lo enviamos a casa con los siguientes medicamentos: - El naproxeno es un medicamento antiinflamatorio no esteroideo que ayudar con Conservation officer, historic buildings y la hinchazn. Asegrese de tomar [employer] segn lo prescrito con  alimentos, 1 pastilla cada 12 horas. Debe tomarse con alimentos, ya que puede causar [employer] y, lo que es ms grave, sangrado estomacal. No tome otros medicamentos antiinflamatorios no esteroides con esto, como Advil, Motrin, Aleve, Mobic, Goodie Powder o  Motrin.  - Tylenol: tmelo cada 8 horas segn sea necesario para aliviar la fiebre o Conservation officer, historic buildings. Le hemos recetado nuevos medicamentos hoy. Analice los medicamentos recetados hoy con su farmacutico, ya que pueden tener efectos adversos e interacciones con sus otros medicamentos, incluidos los medicamentos recetados y de [place]. Busque una evaluacin mdica si comienza a experimentar sntomas nuevos o anormales despus de tomar uno de estos medicamentos, busque atencin mdica de inmediato si comienza a experimentar dificultad para respirar, sensacin de cierre de la garganta, hinchazn facial o sarpullido, ya que estos podran ser indicios de un problema ms grave reaccin alrgica  Haga un seguimiento con atencin primaria en un plazo de 3 a 5 das para una reevaluacin; llame antes de ir al consultorio para informarles de sus sntomas, ya que algunos consultorios estn modificando su mtodo para atender a los pacientes con sntomas de COVID 19. [facility] [place] Covid para seguimiento tambin. Regrese a la sala de emergencias por sntomas nuevos o que empeoran, incluidos, entre otros, un aumento del esfuerzo respiratorio, [employer] pecho, [place] o cualquier otra inquietud.      Person Under Monitoring Name: Jackie Green  Location: [place] [address]   Infection Prevention Recommendations for Individuals Confirmed to have, or Being Evaluated for, 2019 Novel Coronavirus (COVID-19) Infection Who Receive Care at Home  Individuals who are confirmed to have, or are being evaluated for, COVID-19 should follow the prevention steps below until a healthcare provider or local or state health department says they can return to normal activities.  Stay home except to get medical care You should restrict activities outside your home, except for getting medical care. Do not go to work, school, or public areas, and do not use public transportation or taxis.  Call ahead  before visiting your doctor Before your medical appointment, call the healthcare provider and tell them that you have, or are being evaluated for, COVID-19 infection. This will help the healthcare provider's office take steps to keep other people from getting infected. Ask your healthcare provider to call the local or state health department.  Monitor your symptoms Seek prompt medical attention if your illness is worsening (e.g., difficulty breathing). Before going to your medical appointment, call the healthcare provider and tell them that you have, or are being evaluated for, COVID-19 infection. Ask your healthcare provider to call the local or state health department.  Wear a facemask You should wear a facemask that covers your nose and mouth when you are in the same room with other people and when you visit a healthcare provider. People who live with or visit you should also wear a facemask while they are in the same room with you.  Separate yourself from other people in your home As much as possible, you should stay in a different room from other people in your home. Also, you should use a separate bathroom, if available.  Avoid sharing household items You should not share dishes, drinking glasses, cups, eating utensils, towels, bedding, or other items with other people in your home. After using these items, you should wash them thoroughly with soap and water.  Cover your coughs and sneezes Cover your mouth and nose with a tissue when you cough or sneeze,  or you can cough or sneeze into your sleeve. Throw used tissues in a lined trash can, and immediately wash your hands with soap and water for at least 20 seconds or use an alcohol-based hand rub.  Wash your [employer] your hands often and thoroughly with soap and water for at least 20 seconds. You can use an alcohol-based hand sanitizer if soap and water are not available and if your hands are not visibly dirty. Avoid touching  your eyes, nose, and mouth with unwashed hands.   Prevention Steps for Caregivers and Household Members of Individuals Confirmed to have, or Being Evaluated for, COVID-19 Infection Being Cared for in the Home  If you live with, or provide care at home for, a person confirmed to have, or being evaluated for, COVID-19 infection please follow these guidelines to prevent infection:  Follow healthcare provider's instructions Make sure that you understand and can help the patient follow any healthcare provider instructions for all care.  Provide for the patient's basic needs You should help the patient with basic needs in the home and provide support for getting groceries, prescriptions, and other personal needs.  Monitor the patient's symptoms If they are getting sicker, call his or her medical provider and tell them that the patient has, or is being evaluated for, COVID-19 infection. This will help the healthcare provider's office take steps to keep other people from getting infected. Ask the healthcare provider to call the local or state health department.  Limit the number of people who have contact with the patient If possible, have only one caregiver for the patient. Other household members should stay in another home or place of residence. If this is not possible, they should stay in another room, or be separated from the patient as much as possible. Use a separate bathroom, if available. Restrict visitors who do not have an essential need to be in the home.  Keep older adults, very young children, and other sick people away from the patient Keep older adults, very young children, and those who have compromised immune systems or chronic health conditions away from the patient. This includes people with chronic heart, lung, or kidney conditions, diabetes, and cancer.  Ensure good ventilation Make sure that shared spaces in the home have good air flow, such as from an air conditioner  or an opened window, weather permitting.  Wash your hands often Wash your hands often and thoroughly with soap and water for at least 20 seconds. You can use an alcohol based hand sanitizer if soap and water are not available and if your hands are not visibly dirty. Avoid touching your eyes, nose, and mouth with unwashed hands. Use disposable paper towels to dry your hands. If not available, use dedicated cloth towels and replace them when they become wet.  Wear a facemask and gloves Wear a disposable facemask at all times in the room and gloves when you touch or have contact with the patient's blood, body fluids, and/or secretions or excretions, such as sweat, saliva, sputum, nasal mucus, vomit, urine, or feces.  Ensure the mask fits over your nose and mouth tightly, and do not touch it during use. Throw out disposable facemasks and gloves after using them. Do not reuse. Wash your hands immediately after removing your facemask and gloves. If your personal clothing becomes contaminated, carefully remove clothing and launder. Wash your hands after handling contaminated clothing. Place all used disposable facemasks, gloves, and other waste in a lined container before disposing them  with other household waste. Remove gloves and wash your hands immediately after handling these items.  Do not share dishes, glasses, or other household items with the patient Avoid sharing household items. You should not share dishes, drinking glasses, cups, eating utensils, towels, bedding, or other items with a patient who is confirmed to have, or being evaluated for, COVID-19 infection. After the person uses these items, you should wash them thoroughly with soap and water.  Wash laundry thoroughly Immediately remove and wash clothes or bedding that have blood, body fluids, and/or secretions or excretions, such as sweat, saliva, sputum, nasal mucus, vomit, urine, or feces, on them. Wear gloves when handling laundry  from the patient. Read and follow directions on labels of laundry or clothing items and detergent. In general, wash and dry with the warmest temperatures recommended on the label.  Clean all areas the individual has used often Clean all touchable surfaces, such as counters, tabletops, doorknobs, bathroom fixtures, toilets, phones, keyboards, tablets, and bedside tables, every day. Also, clean any surfaces that may have blood, body fluids, and/or secretions or excretions on them. Wear gloves when cleaning surfaces the patient has come in contact with. Use a diluted bleach solution (e.g., dilute bleach with 1 part bleach and 10 parts water) or a household disinfectant with a label that says EPA-registered for coronaviruses. To make a bleach solution at home, add 1 tablespoon of bleach to 1 quart (4 cups) of water. For a larger supply, add  cup of bleach to 1 gallon (16 cups) of water. Read labels of cleaning products and follow recommendations provided on product labels. Labels contain instructions for safe and effective use of the cleaning product including precautions you should take when applying the product, such as wearing gloves or eye protection and making sure you have good ventilation during use of the product. Remove gloves and wash hands immediately after cleaning.  Monitor yourself for signs and symptoms of illness Caregivers and household members are considered close contacts, should monitor their health, and will be asked to limit movement outside of the home to the extent possible. Follow the monitoring steps for close contacts listed on the symptom monitoring form.   ? If you have additional questions, contact your local health department or call the epidemiologist on call at [phone] (available 24/7). ? This guidance is subject to change. For the most up-to-date guidance from [facility], please refer to their  website: [email or web address]

## 2020-06-17 ENCOUNTER — Telehealth: Payer: Self-pay

## 2020-06-17 NOTE — Telephone Encounter (Signed)
[facility] [phone] called pt unable to leave message for HFU appt. Called alternate number caller not accepting calls at this time.

## 2023-11-22 ENCOUNTER — Encounter: Payer: Self-pay | Admitting: Physician Assistant

## 2023-11-22 ENCOUNTER — Telehealth: Payer: Self-pay | Admitting: *Deleted

## 2023-11-23 ENCOUNTER — Other Ambulatory Visit: Payer: Self-pay | Admitting: *Deleted

## 2023-11-23 ENCOUNTER — Inpatient Hospital Stay
Admission: RE | Admit: 2023-11-23 | Discharge: 2023-11-23 | Disposition: A | Discharge status: Home / Self Care | Payer: Self-pay | Source: Ambulatory Visit | Attending: Obstetrics & Gynecology | Admitting: Obstetrics & Gynecology

## 2023-11-23 DIAGNOSIS — Z1231 Encounter for screening mammogram for malignant neoplasm of breast: Secondary | ICD-10-CM
